# Patient Record
Sex: Male | Born: 1970 | Race: White | Hispanic: No | Marital: Married | Smoking: Never smoker
Health system: Southern US, Community
[De-identification: ages and names within clinical notes are randomized; demographics above are authoritative.]

## PROBLEM LIST (undated history)

## (undated) DIAGNOSIS — E213 Hyperparathyroidism, unspecified: Secondary | ICD-10-CM

## (undated) DIAGNOSIS — Z87828 Personal history of other (healed) physical injury and trauma: Secondary | ICD-10-CM

## (undated) HISTORY — PX: SHOULDER ARTHROSCOPY: SHX128

## (undated) HISTORY — PX: PARATHYROIDECTOMY: SHX19

---

## 2007-02-04 ENCOUNTER — Ambulatory Visit: Payer: Self-pay | Admitting: Emergency Medicine

## 2016-04-22 ENCOUNTER — Other Ambulatory Visit: Payer: Self-pay | Admitting: Orthopedic Surgery

## 2016-04-22 DIAGNOSIS — M25561 Pain in right knee: Secondary | ICD-10-CM

## 2016-04-22 DIAGNOSIS — M2391 Unspecified internal derangement of right knee: Secondary | ICD-10-CM

## 2016-04-30 ENCOUNTER — Ambulatory Visit
Admission: RE | Admit: 2016-04-30 | Discharge: 2016-04-30 | Disposition: A | Discharge status: Home / Self Care | Payer: BLUE CROSS/BLUE SHIELD | Source: Ambulatory Visit | Attending: Orthopedic Surgery | Admitting: Orthopedic Surgery

## 2016-04-30 DIAGNOSIS — S83241A Other tear of medial meniscus, current injury, right knee, initial encounter: Secondary | ICD-10-CM | POA: Diagnosis not present

## 2016-04-30 DIAGNOSIS — M25561 Pain in right knee: Secondary | ICD-10-CM | POA: Insufficient documentation

## 2016-04-30 DIAGNOSIS — G8929 Other chronic pain: Secondary | ICD-10-CM | POA: Diagnosis present

## 2016-04-30 DIAGNOSIS — M2391 Unspecified internal derangement of right knee: Secondary | ICD-10-CM

## 2016-04-30 DIAGNOSIS — X58XXXA Exposure to other specified factors, initial encounter: Secondary | ICD-10-CM | POA: Diagnosis not present

## 2016-06-13 ENCOUNTER — Encounter: Payer: Self-pay | Admitting: *Deleted

## 2016-06-13 ENCOUNTER — Ambulatory Visit
Admission: EM | Admit: 2016-06-13 | Discharge: 2016-06-13 | Disposition: A | Discharge status: Hospice | Payer: BLUE CROSS/BLUE SHIELD | Attending: Family Medicine | Admitting: Family Medicine

## 2016-06-13 DIAGNOSIS — R509 Fever, unspecified: Secondary | ICD-10-CM

## 2016-06-13 HISTORY — DX: Personal history of other (healed) physical injury and trauma: Z87.828

## 2016-06-13 HISTORY — DX: Hyperparathyroidism, unspecified: E21.3

## 2016-06-13 LAB — RAPID INFLUENZA A&B ANTIGENS (ARMC ONLY)
INFLUENZA A (ARMC): NEGATIVE
INFLUENZA B (ARMC): NEGATIVE

## 2016-06-13 MED ORDER — ACETAMINOPHEN 500 MG PO TABS
1000.0000 mg | ORAL_TABLET | Freq: Once | ORAL | Status: AC
  Administered 2016-06-13: 1000 mg via ORAL

## 2016-06-13 MED ORDER — ONDANSETRON 8 MG PO TBDP
8.0000 mg | ORAL_TABLET | Freq: Once | ORAL | Status: AC
  Administered 2016-06-13: 8 mg via ORAL

## 2016-06-13 NOTE — Discharge Instructions (Signed)
Go Directly to the ED.  Take care  Dr. Adriana Simasook

## 2016-06-13 NOTE — ED Triage Notes (Signed)
Fever up to 103 last night, body aches, neck ache, nausea, and dizziness, x 3-5 days.

## 2016-06-13 NOTE — ED Provider Notes (Signed)
[facility]    CSN: [id number] Arrival date & time: 06/13/16  1029   History   Chief Complaint Chief Complaint  Patient presents with  . Fever  . Generalized Body Aches  . Neck Pain  . Nausea    HPI [age] are old male presents to urgent care for evaluation of fever.  Patient states that approximate 4 days ago he was just not feeling well. Had some vague neck pain as well as dizziness. His symptoms have progressed and he now has had fever as of last night. Febrile today. He reports associated shortness of breath, continued pain in the occipital region/neck, nausea. He has had some nasal congestion but no purulent nasal discharge. No reports of cough. No urinary difficulty. No reports of back pain or flank pain. No abdominal pain. No known exacerbating or relieving factors.  No other associated symptoms. No other complaint at this time.  Of note, patient's wife has had viral meningitis in the recent past.  Past Medical History:  Diagnosis Date  . History of meniscal tear   . Hyperparathyroidism [facility](HCC)     Past Surgical History:  Procedure Laterality Date  . PARATHYROIDECTOMY    . SHOULDER ARTHROSCOPY      Home Medications    Prior to Admission medications   Not on File   Family History History reviewed. No pertinent family history.  Social History Social History  Substance Use Topics  . Smoking status: Never Smoker  . Smokeless tobacco: Never Used  . Alcohol use Yes   Allergies   Review of patient's allergies indicates no known allergies.   Review of Systems Review of Systems  Constitutional: Positive for fatigue and fever.  HENT: Positive for congestion.   Respiratory: Positive for shortness of breath.   Gastrointestinal: Positive for nausea.  Musculoskeletal: Positive for neck pain.  Neurological: Positive for dizziness and headaches.  All other systems reviewed and are negative.  Physical Exam Triage Vital Signs ED Triage Vitals  Enc  Vitals Group     BP 06/13/16 1043 131/74     Pulse Rate 06/13/16 1043 97     Resp 06/13/16 1043 16     Temp 06/13/16 1043 (!) 102.9 F (39.4 C)     Temp Source 06/13/16 1043 Oral     SpO2 06/13/16 1043 99 %     Weight 06/13/16 1055 220 lb (99.8 kg)     Height 06/13/16 1055 6' (1.829 m)     Head Circumference --      Peak Flow --      Pain Score --      Pain Loc --      Pain Edu? --      Excl. in GC? --    Orthostatic VS for the past 24 hrs:  BP- Lying Pulse- Lying BP- Sitting Pulse- Sitting BP- Standing at 0 minutes Pulse- Standing at 0 minutes  06/13/16 1049 145/84 94 141/81 98 116/70 100    Updated Vital Signs BP 131/74 (BP Location: Left Arm)   Pulse 97   Temp (!) 102.9 F (39.4 C) (Oral)   Resp 16   Ht 6' (1.829 m)   Wt 220 lb (99.8 kg)   SpO2 99%   BMI 29.84 kg/m   Physical Exam  Constitutional:  Appears ill.  HENT:  Head: Normocephalic and atraumatic.  Mouth/Throat: Oropharynx is clear and moist.  Normal TMs bilaterally.  Eyes: Conjunctivae are normal.  Neck:  Normal range of motion of the neck.  No meningeal signs present.  Cardiovascular: Regular rhythm.  Tachycardia present.   Pulmonary/Chest: Effort normal. No respiratory distress. He has no wheezes. He has no rales.  Abdominal: Soft. He exhibits no distension and no mass. There is no tenderness. There is no rebound and no guarding.  Neurological: He is alert.  Skin: No rash noted.  Psychiatric: He has a normal mood and affect.  Vitals reviewed.  UC Treatments / Results  Labs (all labs ordered are listed, but only abnormal results are displayed) Labs Reviewed  RAPID INFLUENZA A&B ANTIGENS ([facility] ONLY)    EKG  EKG Interpretation None       Radiology No results found.  Procedures Procedures (including critical care time)  Medications Ordered in UC Medications  acetaminophen (TYLENOL) tablet 1,000 mg (1,000 mg Oral Given 06/13/16 1048)  ondansetron (ZOFRAN-ODT) disintegrating tablet 8 mg  (8 mg Oral Given 06/13/16 1049)     Initial Impression / Assessment and Plan / UC Course  I have reviewed the triage vital signs and the nursing notes.  Pertinent labs & imaging results that were available during my care of the patient were reviewed by me and considered in my medical decision making (see chart for details).  Clinical Course  45 year old male presents with fever, neck pain, shortness of breath, nausea. Patient appears ill. + SIRS criteria. Concern for pneumonia versus meningitis versus other occult infectious process. Given clinical picture, I recommended that he go to the emergency department for urgent evaluation and assessment. Patient agreement. They will be traveling by private vehicle to [facility].  Final Clinical Impressions(s) / UC Diagnoses   Final diagnoses:  Fever, unspecified fever cause    New Prescriptions New Prescriptions   No medications on file     Tommie Sams, DO 06/13/16 1132

## 2016-06-16 ENCOUNTER — Telehealth: Payer: Self-pay

## 2016-06-16 NOTE — Telephone Encounter (Signed)
Courtesy call back completed today for patients recent visit at [facility]. Patient did not answer, left message on voicemail to call back with any questions or concerns.   

## 2017-02-16 IMAGING — MR MR KNEE*R* W/O CM
6 series · 40 of 40 positions shown · non-contrast
Comparison: None.

CLINICAL DATA: Right knee pain.  Medial pain for 4 months.

EXAM:
MRI OF THE RIGHT KNEE WITHOUT CONTRAST
TECHNIQUE: Multiplanar, multisequence MR imaging of the knee was performed. No
intravenous contrast was administered.

[Series 3: PD fat-sat · axial · 3.0mm · 0.33mm/px · z∈[-60,+48]mm · 8 of 34 slices shown (1 of 4)]
[im 1/34]
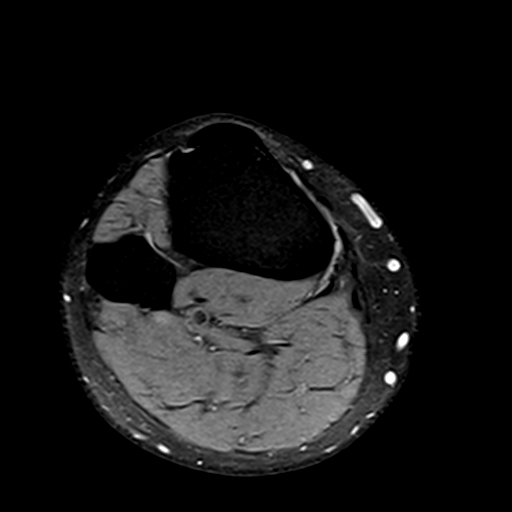
[im 5/34]
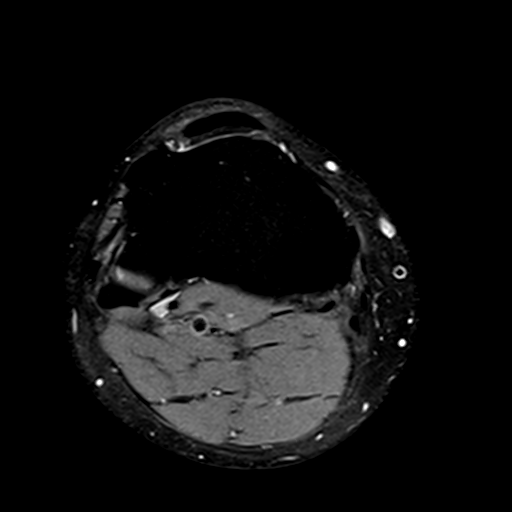
[im 10/34]
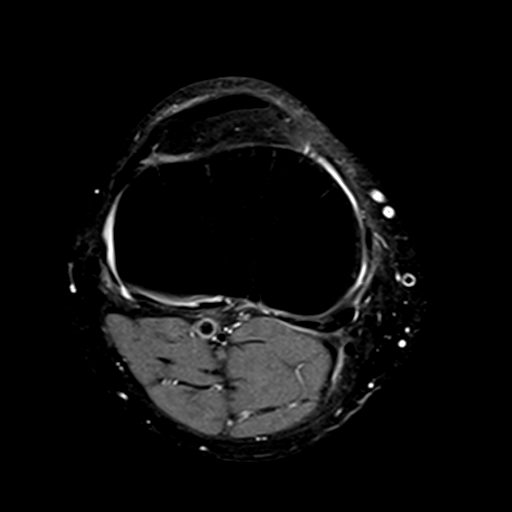
[im 15/34]
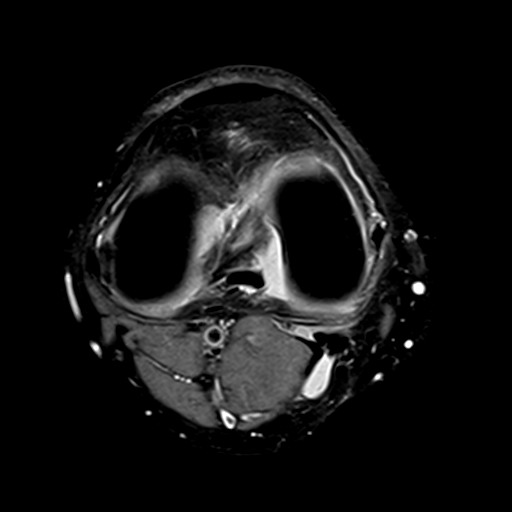
[im 19/34]
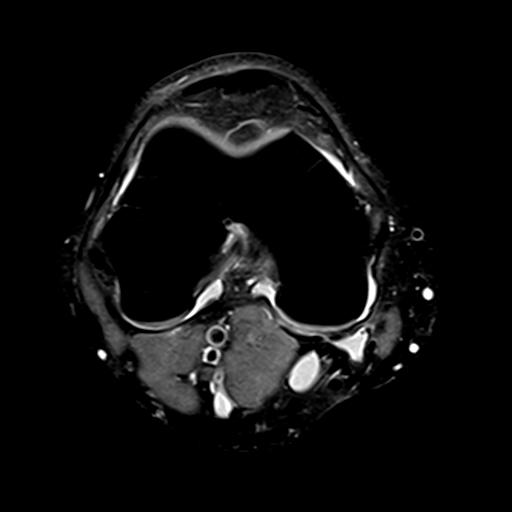
[im 24/34]
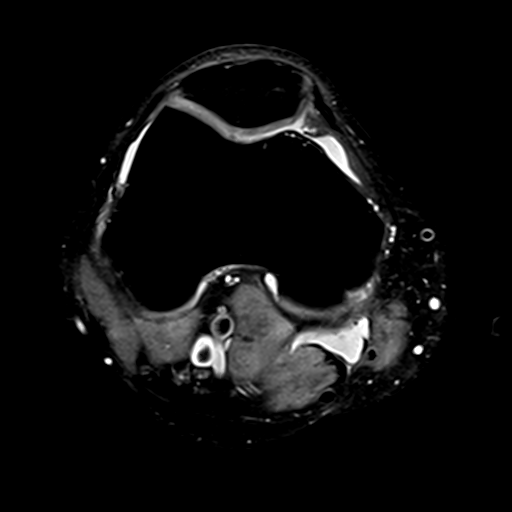
[im 29/34]
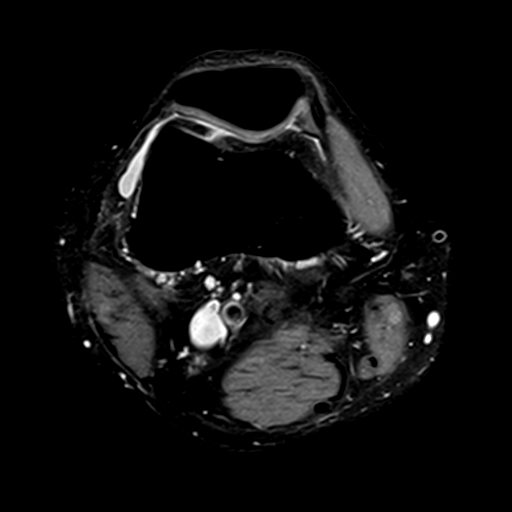
[im 34/34]
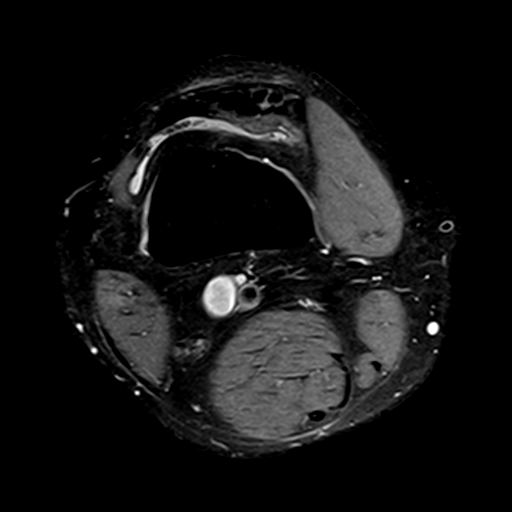

[Series 4: T1 · coronal · 3.0mm · 0.50mm/px · 7 of 31 slices shown]
[im 1/31]
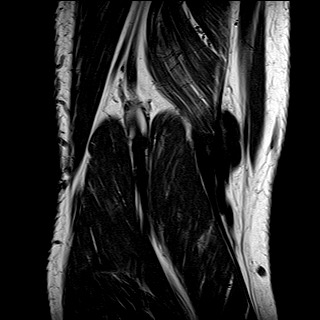
[im 6/31]
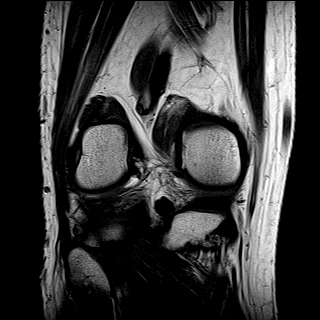
[im 11/31]
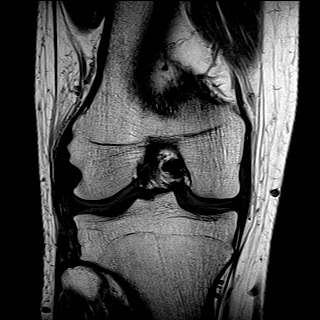
[im 16/31]
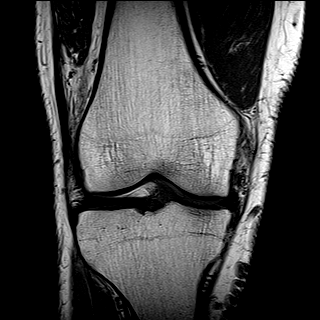
[im 21/31]
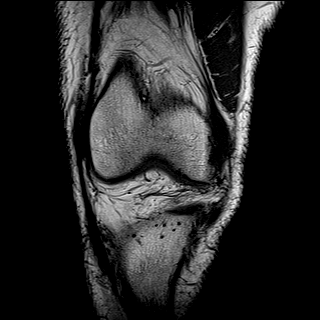
[im 26/31]
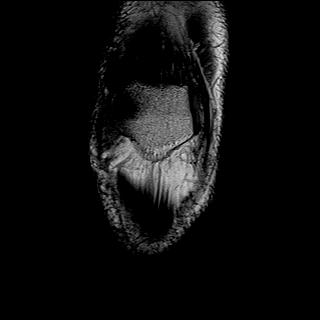
[im 31/31]
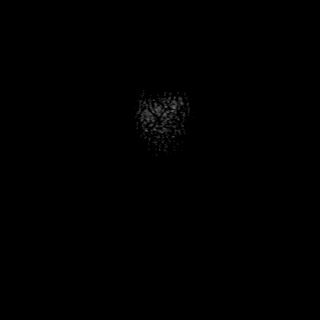

[Series 5: T2 fat-sat · coronal · 3.0mm · 0.50mm/px · 7 of 31 slices shown]
[im 1/31]
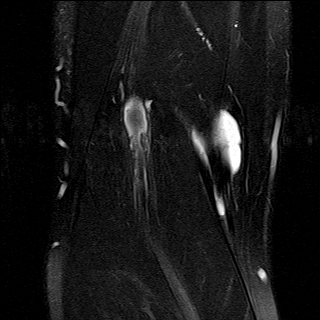
[im 6/31]
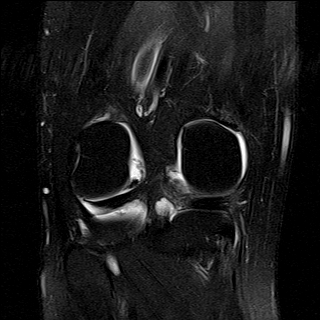
[im 11/31]
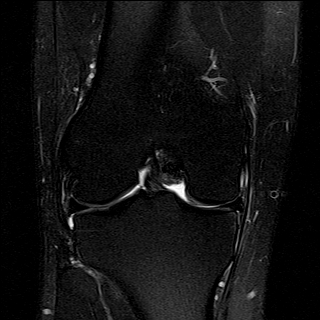
[im 16/31]
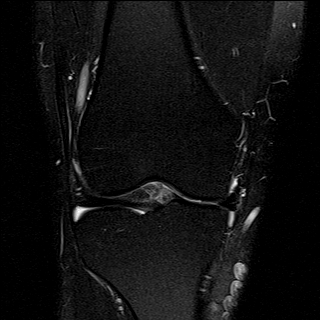
[im 21/31]
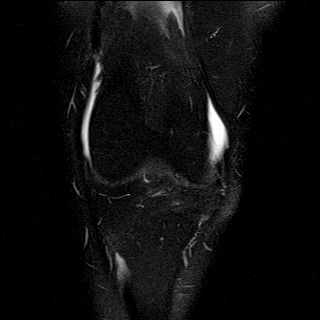
[im 26/31]
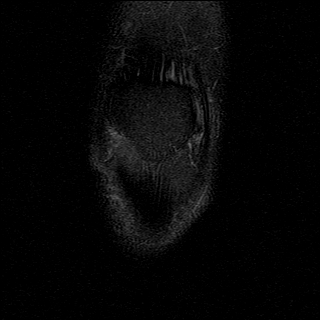
[im 31/31]
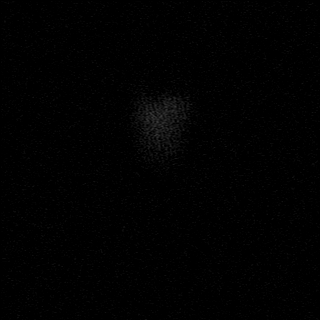

[Series 6: PD fat-sat · sagittal · 3.0mm · 0.62mm/px · 8 of 33 slices shown (2 of 4)]
[im 1/33]
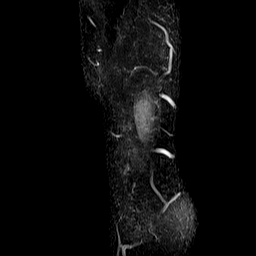
[im 5/33]
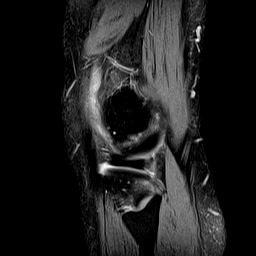
[im 10/33]
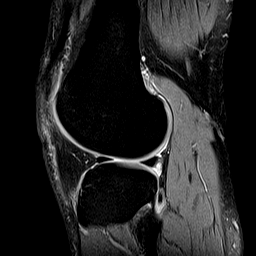
[im 14/33]
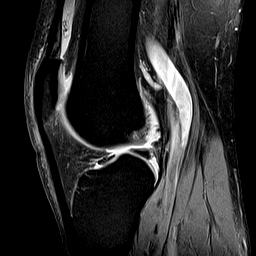
[im 19/33]
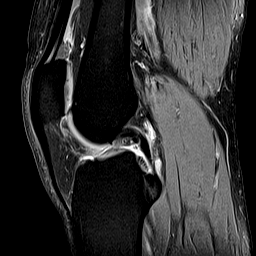
[im 23/33]
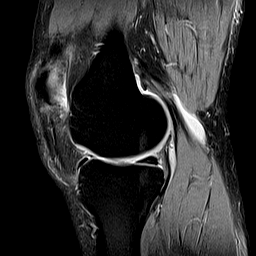
[im 28/33]
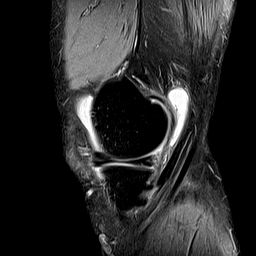
[im 33/33]
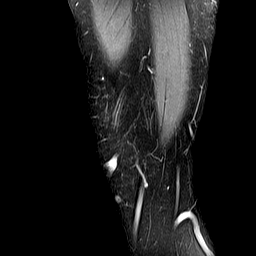

[Series 7: PD fat-sat · coronal · 3.0mm · 0.62mm/px · 7 of 31 slices shown (3 of 4)]
[im 1/31]
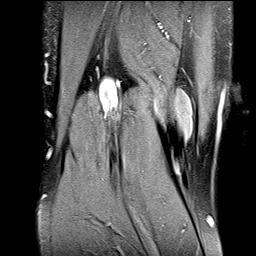
[im 6/31]
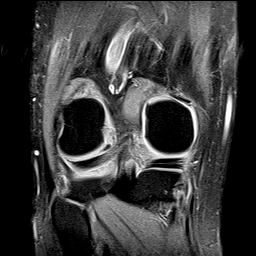
[im 11/31]
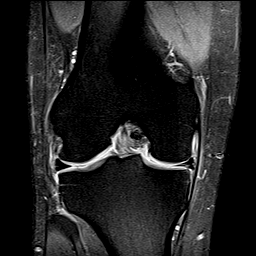
[im 16/31]
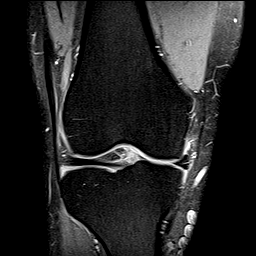
[im 21/31]
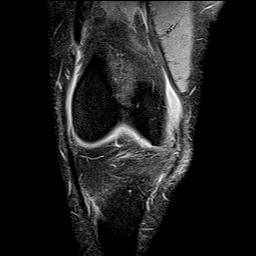
[im 26/31]
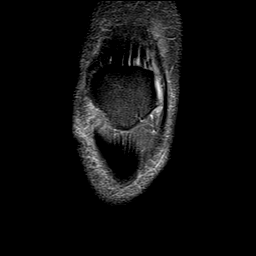
[im 31/31]
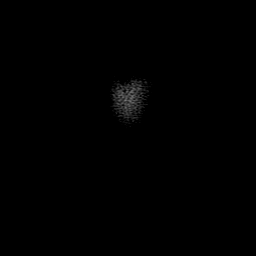

[Series 8: PD fat-sat · coronal · 2.0mm · 0.62mm/px · 3 of 13 slices shown (4 of 4)]
[im 1/13]
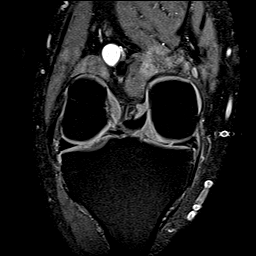
[im 7/13]
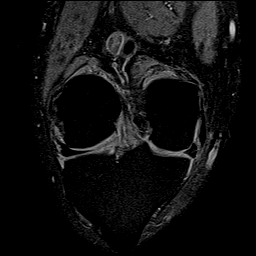
[im 13/13]
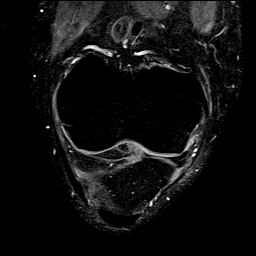

[40 of 40 positions shown; findings below may reference images not displayed]

FINDINGS: MENISCI

Medial meniscus: Pranob Anak tear of the inferior surface of
the posterior horn of the medial meniscus.

Lateral meniscus:  Intact.

LIGAMENTS

Cruciates:  Intact ACL and PCL.

Collaterals:  Insert collateral

CARTILAGE

Patellofemoral:  Cartilage fissuring of the lateral patellar facet.

Medial:  No chondral defect.

Lateral:  No chondral defect.

Joint: Small joint effusion. No plical thickening. Normal Hoffa's
fat.

Popliteal Fossa:  Small Baker cyst.  Intact popliteus tendon.

Extensor Mechanism: Intact quadriceps tendon. Intact patellar
tendon.

Bones:  No marrow signal abnormality.  No fracture or dislocation.

Other: No fluid collection or hematoma.
IMPRESSION: 1. Pranob Anak tear of the inferior surface of the posterior
horn of the medial meniscus.
2. Cartilage fissuring of the lateral patellar facet.

## 2019-07-24 ENCOUNTER — Other Ambulatory Visit: Payer: Self-pay

## 2019-07-24 DIAGNOSIS — Z20822 Contact with and (suspected) exposure to covid-19: Secondary | ICD-10-CM

## 2019-07-26 LAB — NOVEL CORONAVIRUS, NAA: SARS-CoV-2, NAA: NOT DETECTED
